# Patient Record
Sex: Male | Born: 1980 | Race: Black or African American | Hispanic: No | Marital: Married | Smoking: Never smoker
Health system: Southern US, Community
[De-identification: ages and names within clinical notes are randomized; demographics above are authoritative.]

## PROBLEM LIST (undated history)

## (undated) DIAGNOSIS — I10 Essential (primary) hypertension: Secondary | ICD-10-CM

---

## 2013-02-25 ENCOUNTER — Emergency Department (HOSPITAL_COMMUNITY)
Admission: EM | Admit: 2013-02-25 | Discharge: 2013-02-25 | Disposition: A | Discharge status: Home / Self Care | Payer: Medicaid Other | Attending: Emergency Medicine | Admitting: Emergency Medicine

## 2013-02-25 ENCOUNTER — Emergency Department (HOSPITAL_COMMUNITY): Payer: Medicaid Other

## 2013-02-25 ENCOUNTER — Encounter (HOSPITAL_COMMUNITY): Payer: Self-pay | Admitting: Emergency Medicine

## 2013-02-25 ENCOUNTER — Emergency Department (HOSPITAL_COMMUNITY)
Admission: EM | Admit: 2013-02-25 | Discharge: 2013-02-25 | Disposition: A | Discharge status: Nursing Home (Includes ICF) | Payer: Medicaid Other | Source: Home / Self Care | Attending: Emergency Medicine | Admitting: Emergency Medicine

## 2013-02-25 DIAGNOSIS — R109 Unspecified abdominal pain: Secondary | ICD-10-CM

## 2013-02-25 DIAGNOSIS — I10 Essential (primary) hypertension: Secondary | ICD-10-CM | POA: Insufficient documentation

## 2013-02-25 DIAGNOSIS — K2970 Gastritis, unspecified, without bleeding: Secondary | ICD-10-CM | POA: Insufficient documentation

## 2013-02-25 HISTORY — DX: Essential (primary) hypertension: I10

## 2013-02-25 LAB — URINALYSIS, ROUTINE W REFLEX MICROSCOPIC
Bilirubin Urine: NEGATIVE
Glucose, UA: NEGATIVE mg/dL
Ketones, ur: NEGATIVE mg/dL
Leukocytes, UA: NEGATIVE
Nitrite: NEGATIVE
Urobilinogen, UA: 0.2 mg/dL (ref 0.0–1.0)

## 2013-02-25 LAB — CBC WITH DIFFERENTIAL/PLATELET
Basophils Relative: 0 % (ref 0–1)
Eosinophils Absolute: 0 10*3/uL (ref 0.0–0.7)
HCT: 44.8 % (ref 39.0–52.0)
Hemoglobin: 15.8 g/dL (ref 13.0–17.0)
Lymphs Abs: 1.8 10*3/uL (ref 0.7–4.0)
MCH: 30.4 pg (ref 26.0–34.0)
MCHC: 35.3 g/dL (ref 30.0–36.0)
Monocytes Absolute: 0.2 10*3/uL (ref 0.1–1.0)
Monocytes Relative: 5 % (ref 3–12)
Neutrophils Relative %: 59 % (ref 43–77)
Platelets: 209 10*3/uL (ref 150–400)
RBC: 5.2 MIL/uL (ref 4.22–5.81)

## 2013-02-25 LAB — COMPREHENSIVE METABOLIC PANEL
AST: 36 U/L (ref 0–37)
Albumin: 4.4 g/dL (ref 3.5–5.2)
Alkaline Phosphatase: 91 U/L (ref 39–117)
BUN: 9 mg/dL (ref 6–23)
Chloride: 103 mEq/L (ref 96–112)
Creatinine, Ser: 0.94 mg/dL (ref 0.50–1.35)
GFR calc Af Amer: >90 mL/min (ref >90)
Glucose, Bld: 89 mg/dL (ref 70–99)
Potassium: 3.9 mEq/L (ref 3.5–5.1)
Total Bilirubin: 0.5 mg/dL (ref 0.3–1.2)
Total Protein: 7.9 g/dL (ref 6.0–8.3)

## 2013-02-25 LAB — LIPASE, BLOOD: Lipase: 25 U/L (ref 11–59)

## 2013-02-25 MED ORDER — PANTOPRAZOLE SODIUM 20 MG PO TBEC: 20.0000 mg | DELAYED_RELEASE_TABLET | Freq: Two times a day (BID) | ORAL | Status: AC

## 2013-02-25 MED ORDER — PANTOPRAZOLE SODIUM 40 MG IV SOLR
40.0000 mg | Freq: Once | INTRAVENOUS | Status: AC
  Administered 2013-02-25: 40 mg via INTRAVENOUS
  Filled 2013-02-25: qty 40

## 2013-02-25 NOTE — ED Notes (Signed)
Pt sent from [address] [facility] for eval of abd pain x 3 days that is worse after eating

## 2013-02-25 NOTE — ED Provider Notes (Signed)
CSN: [id number]     Arrival date & time 02/25/13  1127 History   First MD Initiated Contact with Patient 02/25/13 1144     Chief Complaint  Patient presents with  . Abdominal Pain   (Consider location/radiation/quality/duration/timing/severity/associated sxs/prior Treatment) HPI Comments: Patient is otherwise healthy 32 year old male who presents to the ED from [facility] for further evaluation of 3 day history of epigastric and RUQ abdominal pain.  He denies pain currently.  He states that this pain is associated with food intake and lying flat.  He reports daily alcohol use because of stressors in his life and a history of GERD.  He states nausea without vomiting, denies abdominal surgeries, fever, chills, cough, congestion, dysuria, hematuria, constipation or diarrhea.  Patient is a 32 y.o. male presenting with abdominal pain. The history is provided by the patient and the spouse. No language interpreter was used.  Abdominal Pain Pain location:  Epigastric and RUQ Pain quality: burning, cramping and squeezing   Pain radiates to:  RUQ Pain severity:  Moderate Onset quality:  Gradual Duration:  3 days Timing:  Intermittent Progression:  Worsening Chronicity:  New Context: alcohol use and eating   Context: not diet changes, not previous surgeries, not recent travel, not sick contacts, not suspicious food intake and not trauma   Relieved by:  Nothing Worsened by:  Nothing tried Ineffective treatments:  None tried Associated symptoms: nausea   Associated symptoms: no anorexia, no chest pain, no constipation, no cough, no diarrhea, no fever, no hematemesis, no hematuria, no shortness of breath and no vomiting     Past Medical History  Diagnosis Date  . Hypertension    History reviewed. No pertinent past surgical history. History reviewed. No pertinent family history. History  Substance Use Topics  . Smoking status: Never Smoker   . Smokeless tobacco: Not on file  . Alcohol Use: Yes     Review of Systems  Constitutional: Negative for fever.  Respiratory: Negative for cough and shortness of breath.   Cardiovascular: Negative for chest pain.  Gastrointestinal: Positive for nausea and abdominal pain. Negative for vomiting, diarrhea, constipation, anorexia and hematemesis.  Genitourinary: Negative for hematuria.  All other systems reviewed and are negative.    Allergies  Review of patient's allergies indicates no known allergies.  Home Medications  No current outpatient prescriptions on file. BP 127/92  Pulse 88  Temp(Src) 98.5 F (36.9 C) (Oral)  Resp 20  Ht 5\' 9"  (1.753 m)  Wt 187 lb 3.2 oz (84.913 kg)  BMI 27.63 kg/m2  SpO2 100% Physical Exam  Nursing note and vitals reviewed. Constitutional: He is oriented to person, place, and time. He appears well-developed and well-nourished. No distress.  HENT:  Head: Normocephalic and atraumatic.  Right Ear: External ear normal.  Left Ear: External ear normal.  Nose: Nose normal.  Mouth/Throat: Oropharynx is clear and moist. No oropharyngeal exudate.  Eyes: Conjunctivae are normal. Pupils are equal, round, and reactive to light. No scleral icterus.  Neck: Normal range of motion. Neck supple.  Cardiovascular: Normal rate, regular rhythm and normal heart sounds.  Exam reveals no gallop and no friction rub.   No murmur heard. Pulmonary/Chest: Effort normal and breath sounds normal. No respiratory distress. He has no wheezes. He has no rales. He exhibits no tenderness.  Abdominal: Soft. Bowel sounds are normal. He exhibits no distension. There is no tenderness. There is no rebound and no guarding.  No tenderness to palpation and no pain at this time.  Musculoskeletal: Normal range of motion. He exhibits no edema and no tenderness.  Lymphadenopathy:    He has no cervical adenopathy.  Neurological: He is alert and oriented to person, place, and time. He exhibits normal muscle tone. Coordination normal.  Skin: Skin is  warm and dry. No rash noted. No erythema. No pallor.  Psychiatric: He has a normal mood and affect. His behavior is normal. Judgment and thought content normal.    ED Course  Procedures (including critical care time) Labs Review Labs Reviewed  CBC WITH DIFFERENTIAL  COMPREHENSIVE METABOLIC PANEL  LIPASE, BLOOD  URINALYSIS, ROUTINE W REFLEX MICROSCOPIC   Imaging Review No results found.  EKG Interpretation   None      Results for orders placed during the hospital encounter of 02/25/13  CBC WITH DIFFERENTIAL      Result Value Range   WBC 5.1  4.0 - 10.5 K/uL   RBC 5.20  4.22 - 5.81 MIL/uL   Hemoglobin 15.8  13.0 - 17.0 g/dL   HCT 16.1  09.6 - 04.5 %   MCV 86.2  78.0 - 100.0 fL   MCH 30.4  26.0 - 34.0 pg   MCHC 35.3  30.0 - 36.0 g/dL   RDW 40.9  81.1 - 91.4 %   Platelets 209  150 - 400 K/uL   Neutrophils Relative % 59  43 - 77 %   Neutro Abs 3.0  1.7 - 7.7 K/uL   Lymphocytes Relative 35  12 - 46 %   Lymphs Abs 1.8  0.7 - 4.0 K/uL   Monocytes Relative 5  3 - 12 %   Monocytes Absolute 0.2  0.1 - 1.0 K/uL   Eosinophils Relative 1  0 - 5 %   Eosinophils Absolute 0.0  0.0 - 0.7 K/uL   Basophils Relative 0  0 - 1 %   Basophils Absolute 0.0  0.0 - 0.1 K/uL  COMPREHENSIVE METABOLIC PANEL      Result Value Range   Sodium 141  135 - 145 mEq/L   Potassium 3.9  3.5 - 5.1 mEq/L   Chloride 103  96 - 112 mEq/L   CO2 30  19 - 32 mEq/L   Glucose, Bld 89  70 - 99 mg/dL   BUN 9  6 - 23 mg/dL   Creatinine, Ser 7.82  0.50 - 1.35 mg/dL   Calcium 9.3  8.4 - 95.6 mg/dL   Total Protein 7.9  6.0 - 8.3 g/dL   Albumin 4.4  3.5 - 5.2 g/dL   AST 36  0 - 37 U/L   ALT 128 (*) 0 - 53 U/L   Alkaline Phosphatase 91  39 - 117 U/L   Total Bilirubin 0.5  0.3 - 1.2 mg/dL   GFR calc non Af Amer >90  >90 mL/min   GFR calc Af Amer >90  >90 mL/min  LIPASE, BLOOD      Result Value Range   Lipase 25  11 - 59 U/L  URINALYSIS, ROUTINE W REFLEX MICROSCOPIC      Result Value Range   Color, Urine YELLOW   YELLOW   APPearance CLEAR  CLEAR   Specific Gravity, Urine 1.020  1.005 - 1.030   pH 6.5  5.0 - 8.0   Glucose, UA NEGATIVE  NEGATIVE mg/dL   Hgb urine dipstick NEGATIVE  NEGATIVE   Bilirubin Urine NEGATIVE  NEGATIVE   Ketones, ur NEGATIVE  NEGATIVE mg/dL   Protein, ur NEGATIVE  NEGATIVE mg/dL   Urobilinogen,  UA 0.2  0.0 - 1.0 mg/dL   Nitrite NEGATIVE  NEGATIVE   Leukocytes, UA NEGATIVE  NEGATIVE   US Abdomen Complete  02/25/2013   CLINICAL DATA:  Abdominal pain  EXAM: ULTRASOUND ABDOMEN COMPLETE  COMPARISON:  None.  FINDINGS: Gallbladder  No gallstones, gallbladder wall thickening, or pericholecystic fluid. Negative sonographic Murphy's sign.  Common bile duct  Diameter: 5 mm.  Liver  No focal lesion identified. Within normal limits in parenchymal echogenicity.  IVC  No abnormality visualized.  Pancreas  Poorly visualized due to overlying bowel gas.  Spleen  Measures 6.6 cm.  Right Kidney  Length: 11.3 cm.  No mass or hydronephrosis.  Left Kidney  Length: 12.4 cm.  9 x 8 x 10 mm upper pole cyst.  No hydronephrosis.  Abdominal aorta  No aneurysm visualized.  IMPRESSION: 10 mm left upper pole renal cyst.  Otherwise negative abdominal ultrasound.   Electronically Signed   By: Charline Bills M.D.   On: 02/25/2013 14:12      MDM  Alcoholic gastritis   Patient here from [facility] for evaluation of epigastric abdominal pain, no evidence of gallbladder disease, likely this is more alcoholic gastritis.  Continued with no pain here.  Patient given protonix here - will prescribe the same.   Izola Price Marisue Humble, PA-C 02/25/13 1445

## 2013-02-25 NOTE — ED Notes (Signed)
Pt c/o abd pain onset 3 days Sxs also include: n/v Denies: f/d, cold sxs Reports he just got over the cold last week Alert w/no signs of acute distress.

## 2013-02-25 NOTE — ED Provider Notes (Signed)
CSN: [id number]     Arrival date & time 02/25/13  4098 History   First MD Initiated Contact with Patient 02/25/13 1031     Chief Complaint  Patient presents with  . Abdominal Pain   (Consider location/radiation/quality/duration/timing/severity/associated sxs/prior Treatment) HPI Comments: 32 year old male presents complaining of abdominal pain. This pain began approximately 10 days ago and was very mild at that time. Since that time, the pain has grown increasingly more severe and has been constant for the last 2 days. The pain is exacerbated by eating  anything and is somewhat relieved by not eating. the pain radiates to his back and sometimes up to his right shoulder. he was vomiting and last vomited on Sunday, 3 days ago. He has not had any diarrhea. He has a strong family history of gallbladder disease. He has been drinking a large amount of alcohol for the past month every night. He denies fever, chills, hematuria, dysuria, flank pain. He has taken Phenergan for the nausea which did help.   Patient is a 32 y.o. male presenting with abdominal pain.  Abdominal Pain Associated symptoms include abdominal pain. Pertinent negatives include no chest pain and no shortness of breath.    History reviewed. No pertinent past medical history. History reviewed. No pertinent past surgical history. No family history on file. History  Substance Use Topics  . Smoking status: Never Smoker   . Smokeless tobacco: Not on file  . Alcohol Use: Yes    Review of Systems  Constitutional: Negative for fever, chills and fatigue.  HENT: Negative for sore throat.   Eyes: Negative for visual disturbance.  Respiratory: Negative for cough and shortness of breath.   Cardiovascular: Negative for chest pain, palpitations and leg swelling.  Gastrointestinal: Positive for nausea, vomiting and abdominal pain. Negative for diarrhea and constipation.  Genitourinary: Negative for dysuria, urgency, frequency and  hematuria.  Musculoskeletal: Negative for arthralgias, myalgias, neck pain and neck stiffness.  Skin: Negative for rash.  Neurological: Negative for dizziness, weakness and light-headedness.    Allergies  Review of patient's allergies indicates no known allergies.  Home Medications  No current outpatient prescriptions on file. BP 140/89  Pulse 83  Temp(Src) 98.6 F (37 C) (Oral)  Resp 18  SpO2 100% Physical Exam  Nursing note and vitals reviewed. Constitutional: He is oriented to person, place, and time. He appears well-developed and well-nourished. No distress.  HENT:  Head: Normocephalic.  Pulmonary/Chest: Effort normal. No respiratory distress.  Abdominal: Soft. There is no hepatosplenomegaly (difficult to assess due to musculature). There is tenderness in the right lower quadrant and epigastric area. There is no rigidity, no guarding, no CVA tenderness and negative Murphy's sign.  Neurological: He is alert and oriented to person, place, and time. Coordination normal.  Skin: Skin is warm and dry. No rash noted. He is not diaphoretic.  Psychiatric: He has a normal mood and affect. Judgment normal.    ED Course  Procedures (including critical care time) Labs Review Labs Reviewed - No data to display Imaging Review No results found.    MDM   1. Abdominal pain    He provided history of this patient's pain is consistent with biliary disease. The differential for this includes cholelithiasis/cholecystitis, pancreatitis, ulcer. He has been consuming an excessive amount of alcohol recently which may predispose to pancreatitis or cholelithiasis. He has a strong family history of gallbladder disease. His exam is relatively unremarkable but he does have abdominal tenderness in the epigastrium that radiates to the back.  I believe this patient would benefit from a CT scan of the abdomen as part of his evaluation. He is being transferred to the emergency department for further  evaluation.     Graylon Good, PA-C 02/25/13 1102

## 2013-02-25 NOTE — ED Provider Notes (Signed)
Medical screening examination/treatment/procedure(s) were performed by non-physician practitioner and as supervising physician I was immediately available for consultation/collaboration.  EKG Interpretation   None         Lyanne Co, MD 02/25/13 [phone]

## 2013-02-25 NOTE — ED Notes (Addendum)
Scarlette Calico PA at bedside. Pt aware of need for urine specimen.

## 2013-02-25 NOTE — ED Provider Notes (Signed)
Medical screening examination/treatment/procedure(s) were performed by non-physician practitioner and as supervising physician I was immediately available for consultation/collaboration.  Leslee Home, M.D.  Reuben Likes, MD 02/25/13 [phone]

## 2013-05-09 ENCOUNTER — Encounter (HOSPITAL_COMMUNITY): Payer: Self-pay | Admitting: Emergency Medicine

## 2013-05-09 ENCOUNTER — Emergency Department (INDEPENDENT_AMBULATORY_CARE_PROVIDER_SITE_OTHER)
Admission: EM | Admit: 2013-05-09 | Discharge: 2013-05-09 | Disposition: A | Discharge status: Home / Self Care | Payer: Worker's Compensation | Source: Home / Self Care | Attending: Family Medicine | Admitting: Family Medicine

## 2013-05-09 DIAGNOSIS — Y990 Civilian activity done for income or pay: Secondary | ICD-10-CM

## 2013-05-09 DIAGNOSIS — S060X9A Concussion with loss of consciousness of unspecified duration, initial encounter: Secondary | ICD-10-CM | POA: Diagnosis not present

## 2013-05-09 DIAGNOSIS — S060XAA Concussion with loss of consciousness status unknown, initial encounter: Secondary | ICD-10-CM | POA: Diagnosis not present

## 2013-05-09 DIAGNOSIS — T1490XA Injury, unspecified, initial encounter: Secondary | ICD-10-CM | POA: Diagnosis not present

## 2013-05-09 MED ORDER — DICLOFENAC SODIUM 75 MG PO TBEC: 75.0000 mg | DELAYED_RELEASE_TABLET | Freq: Two times a day (BID) | ORAL | Status: AC | PRN

## 2013-05-09 NOTE — ED Notes (Signed)
Was called to registration because patient was nauseated.  Patient reports hitting his head today on a door facing, slight redness to left eyebrow, some swelling.  Patient denies loc, remembers everything.  Patient has a headache, and has nausea that is increasing.  Informed placement in room would not take much longer and let staff know if anything changes

## 2013-05-09 NOTE — Discharge Instructions (Signed)
Thank you for coming in today. Used diclofenac twice daily as needed for pain. Take it easy and avoid eye strain. Return to work when not feeling foggy or fuzzy at rest. Followup with me next Sunday or Dr. Katrinka Blazing this upcoming week as needed.  Concussion, Adult A concussion, or closed-head injury, is a brain injury caused by a direct blow to the head or by a quick and sudden movement (jolt) of the head or neck. Concussions are usually not life-threatening. Even so, the effects of a concussion can be serious. If you have had a concussion before, you are more likely to experience concussion-like symptoms after a direct blow to the head.  CAUSES   Direct blow to the head, such as from running into another player during a soccer game, being hit in a fight, or hitting your head on a hard surface.  A jolt of the head or neck that causes the brain to move back and forth inside the skull, such as in a car crash. SIGNS AND SYMPTOMS  The signs of a concussion can be hard to notice. Early on, they may be missed by you, family members, and health care providers. You may look fine but act or feel differently. Symptoms are usually temporary, but they may last for days, weeks, or even longer. Some symptoms may appear right away while others may not show up for hours or days. Every head injury is different. Symptoms include:   Mild to moderate headaches that will not go away.  A feeling of pressure inside your head.  Having more trouble than usual:   Learning or remembering things you have heard.  Answering questions.  Paying attention or concentrating.   Organizing daily tasks.   Making decisions and solving problems.   Slowness in thinking, acting or reacting, speaking, or reading.   Getting lost or being easily confused.   Feeling tired all the time or lacking energy (fatigued).   Feeling drowsy.   Sleep disturbances.   Sleeping more than usual.   Sleeping less than usual.    Trouble falling asleep.   Trouble sleeping (insomnia).   Loss of balance or feeling lightheaded or dizzy.   Nausea or vomiting.   Numbness or tingling.   Increased sensitivity to:   Sounds.   Lights.   Distractions.   Vision problems or eyes that tire easily.   Diminished sense of taste or smell.   Ringing in the ears.   Mood changes such as feeling sad or anxious.   Becoming easily irritated or angry for little or no reason.   Lack of motivation.  Seeing or hearing things other people do not see or hear (hallucinations). DIAGNOSIS  Your health care provider can usually diagnose a concussion based on a description of your injury and symptoms. He or she will ask whether you passed out (lost consciousness) and whether you are having trouble remembering events that happened right before and during your injury.  Your evaluation might include:   A brain scan to look for signs of injury to the brain. Even if the test shows no injury, you may still have a concussion.   Blood tests to be sure other problems are not present. TREATMENT   Concussions are usually treated in an emergency department, in urgent care, or at a clinic. You may need to stay in the hospital overnight for further treatment.   Tell your health care provider if you are taking any medicines, including prescription medicines, over-the-counter medicines, and  natural remedies. Some medicines, such as blood thinners (anticoagulants) and aspirin, may increase the chance of complications. Also tell your health care provider whether you have had alcohol or are taking illegal drugs. This information may affect treatment.  Your health care provider will send you home with important instructions to follow.  How fast you will recover from a concussion depends on many factors. These factors include how severe your concussion is, what part of your brain was injured, your age, and how healthy you were  before the concussion.  Most people with mild injuries recover fully. Recovery can take time. In general, recovery is slower in older persons. Also, persons who have had a concussion in the past or have other medical problems may find that it takes longer to recover from their current injury. HOME CARE INSTRUCTIONS  General Instructions  Carefully follow the directions your health care provider gave you.  Only take over-the-counter or prescription medicines for pain, discomfort, or fever as directed by your health care provider.  Take only those medicines that your health care provider has approved.  Do not drink alcohol until your health care provider says you are well enough to do so. Alcohol and certain other drugs may slow your recovery and can put you at risk of further injury.  If it is harder than usual to remember things, write them down.  If you are easily distracted, try to do one thing at a time. For example, do not try to watch TV while fixing dinner.  Talk with family members or close friends when making important decisions.  Keep all follow-up appointments. Repeated evaluation of your symptoms is recommended for your recovery.  Watch your symptoms and tell others to do the same. Complications sometimes occur after a concussion. Older adults with a brain injury may have a higher risk of serious complications such as of a blood clot on the brain.  Tell your teachers, school nurse, school counselor, coach, athletic trainer, or work Production designer, theatre/television/film about your injury, symptoms, and restrictions. Tell them about what you can or cannot do. They should watch for:   Increased problems with attention or concentration.   Increased difficulty remembering or learning new information.   Increased time needed to complete tasks or assignments.   Increased irritability or decreased ability to cope with stress.   Increased symptoms.   Rest. Rest helps the brain to heal. Make sure  you:  Get plenty of sleep at night. Avoid staying up late at night.  Keep the same bedtime hours on weekends and weekdays.  Rest during the day. Take daytime naps or rest breaks when you feel tired.  Limit activities that require a lot of thought or concentration. These includes   Doing homework or job-related work.   Watching TV.   Working on the computer.  Avoid any situation where there is potential for another head injury (football, hockey, soccer, basketball, martial arts, downhill snow sports and horseback riding). Your condition will get worse every time you experience a concussion. You should avoid these activities until you are evaluated by the appropriate follow-up caregivers. Returning To Your Regular Activities You will need to return to your normal activities slowly, not all at once. You must give your body and brain enough time for recovery.  Do not return to sports or other athletic activities until your health care provider tells you it is safe to do so.  Ask your health care provider when you can drive, ride a bicycle, or operate  heavy machinery. Your ability to react may be slower after a brain injury. Never do these activities if you are dizzy.  Ask your health care provider about when you can return to work or school. Preventing Another Concussion It is very important to avoid another brain injury, especially before you have recovered. In rare cases, another injury can lead to permanent brain damage, brain swelling, or death. The risk of this is greatest during the first 7 10 days after a head injury. Avoid injuries by:   Wearing a seat belt when riding in a car.   Drinking alcohol only in moderation.   Wearing a helmet when biking, skiing, skateboarding, skating, or doing similar activities.  Avoiding activities that could lead to a second concussion, such as contact or recreational sports, until your health care provider says it is OK.  Taking safety  measures in your home.   Remove clutter and tripping hazards from floors and stairways.   Use grab bars in bathrooms and handrails by stairs.   Place non-slip mats on floors and in bathtubs.   Improve lighting in dim areas. SEEK MEDICAL CARE IF:   You have increased problems paying attention or concentrating.   You have increased difficulty remembering or learning new information.   You need more time to complete tasks or assignments than before.   You have increased irritability or decreased ability to cope with stress.  You have more symptoms than before. Seek medical care if you have any of the following symptoms for more than 2 weeks after your injury:   Lasting (chronic) headaches.   Dizziness or balance problems.   Nausea.  Vision problems.   Increased sensitivity to noise or light.   Depression or mood swings.   Anxiety or irritability.   Memory problems.   Difficulty concentrating or paying attention.   Sleep problems.   Feeling tired all the time. SEEK IMMEDIATE MEDICAL CARE IF:   You have severe or worsening headaches. These may be a sign of a blood clot in the brain.  You have weakness (even if only in one hand, leg, or part of the face).  You have numbness.  You have decreased coordination.   You vomit repeatedly.  You have increased sleepiness.  One pupil is larger than the other.   You have convulsions.   You have slurred speech.   You have increased confusion. This may be a sign of a blood clot in the brain.  You have increased restlessness, agitation, or irritability.   You are unable to recognize people or places.   You have neck pain.   It is difficult to wake you up.   You have unusual behavior changes.   You lose consciousness. MAKE SURE YOU:   Understand these instructions.  Will watch your condition.  Will get help right away if you are not doing well or get worse. Document Released:  06/09/2003 Document Revised: 11/19/2012 Document Reviewed: 10/09/2012 [facility] Patient Information 2014 [place], [place].

## 2013-05-09 NOTE — ED Provider Notes (Signed)
Alex Hays is a 33 y.o. male who presents to Urgent Care today for head injury. Patient hit his left forehead/eyebrow on the door frame at work today at about 2:15. He denies any loss of consciousness. He notes feeling foggy and fuzzy with a mild headache and mild transient nausea. He notes photophobia as well. He has had a concussion in the past and says his symptoms are consistent with prior concussion. He denies any weakness or numbness loss of function or difficulty swallowing or slurred speech. His wife says that he is acting normally.   Past Medical History  Diagnosis Date  . Hypertension    History  Substance Use Topics  . Smoking status: Never Smoker   . Smokeless tobacco: Not on file  . Alcohol Use: Yes   ROS as above Medications: No current facility-administered medications for this encounter.   Current Outpatient Prescriptions  Medication Sig Dispense Refill  . pantoprazole (PROTONIX) 20 MG tablet Take 1 tablet (20 mg total) by mouth 2 (two) times daily.  60 tablet  2  . diclofenac (VOLTAREN) 75 MG EC tablet Take 1 tablet (75 mg total) by mouth 2 (two) times daily as needed.  60 tablet  0  . [DISCONTINUED] promethazine (PHENERGAN) 25 MG tablet Take 25 mg by mouth every 6 (six) hours as needed for nausea or vomiting.        Exam:  BP 141/89  Pulse 73  Temp(Src) 98.9 F (37.2 C) (Oral)  Resp 16  SpO2 100% Gen: Well NAD HEENT: EOMI,  MMM, left eyebrow contusion. No crepitations palpated. PERRLA Lungs: Normal work of breathing. CTABL Heart: RRR no MRG Abd: NABS, Soft. NT, ND Exts: Brisk capillary refill, warm and well perfused.  Neuro: Alert and oriented x3 cranial nerves II through XII are intact normal balance coordination sensation and gait. Finger-nose-finger is intact.  Months of the year backwards is intact. 3 item recall is intact initially but 2/3 delayed.   Assessment and Plan: 33 y.o. male with concussion. Plan for treatment with diclofenac for  headache. Advise cognitive rest. Work note provided. Followup with sports medicine or myself in one week if not improving  Discussed warning signs or symptoms. Please see discharge instructions. Patient expresses understanding.    Rodolph BongEvan S Dolton Shaker, MD 05/09/13 [phone]

## 2013-05-09 NOTE — ED Notes (Signed)
Pt reports head inj today around 1415 while at work Ran into a wooden door and hit the left side head/eyebrow Denies LOC; pain is 8/10 Alert and talking in complete sentences w/no signs of acute distress.

## 2014-06-15 IMAGING — US US ABDOMEN COMPLETE
1 series · 14 of 25 positions shown · non-contrast
Comparison: None.

CLINICAL DATA: Abdominal pain

EXAM:
ULTRASOUND ABDOMEN COMPLETE

[Series 1: us abdomen complete · 0.24mm/px · 14 of 70 slices shown]
[im 1/70]
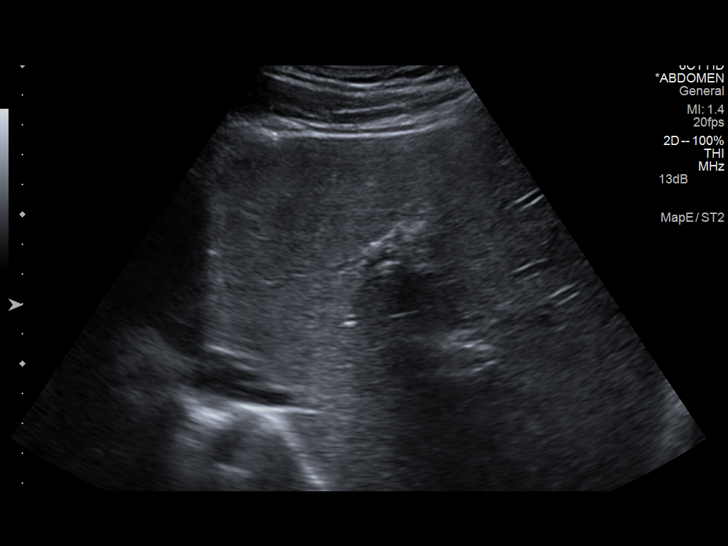
[im 6/70]
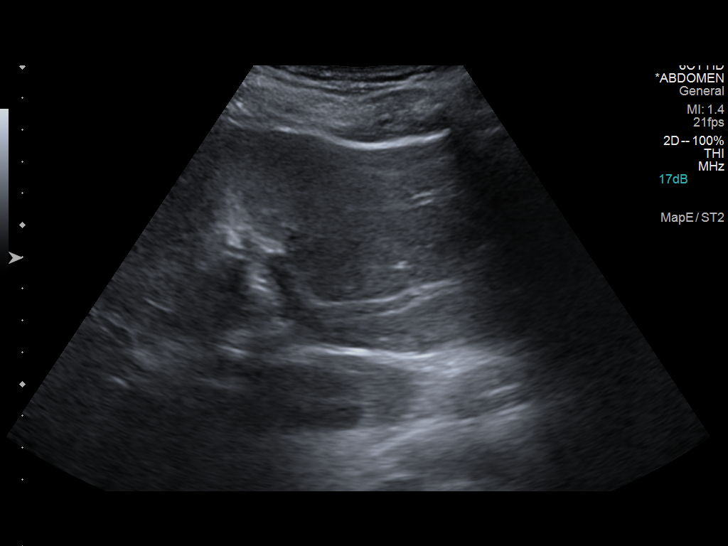
[im 12/70]
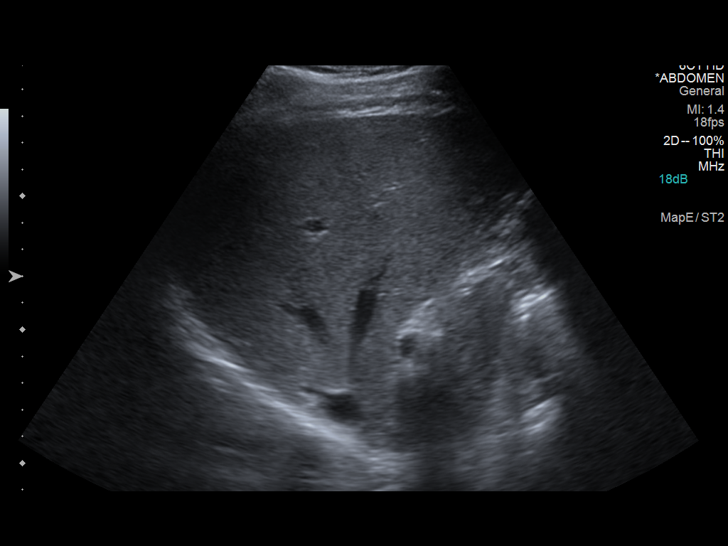
[im 18/70]
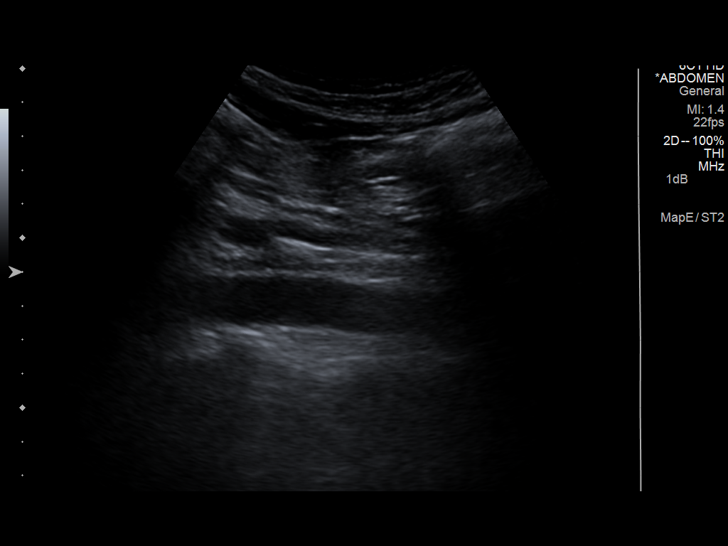
[im 24/70]
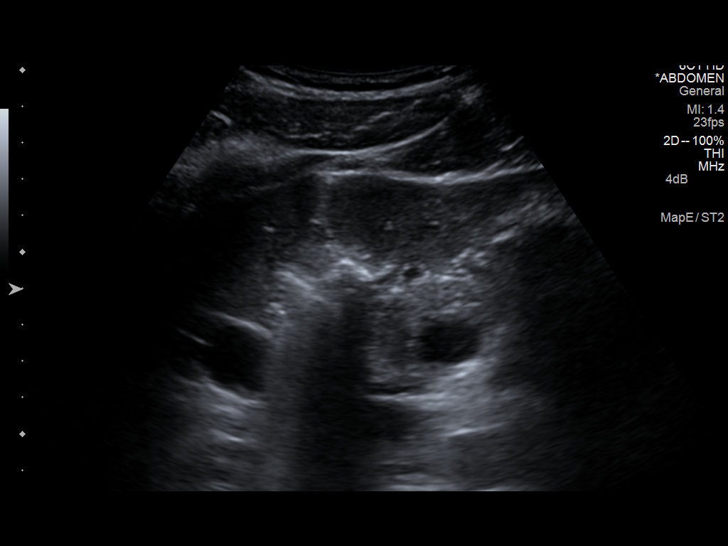
[im 26/70]
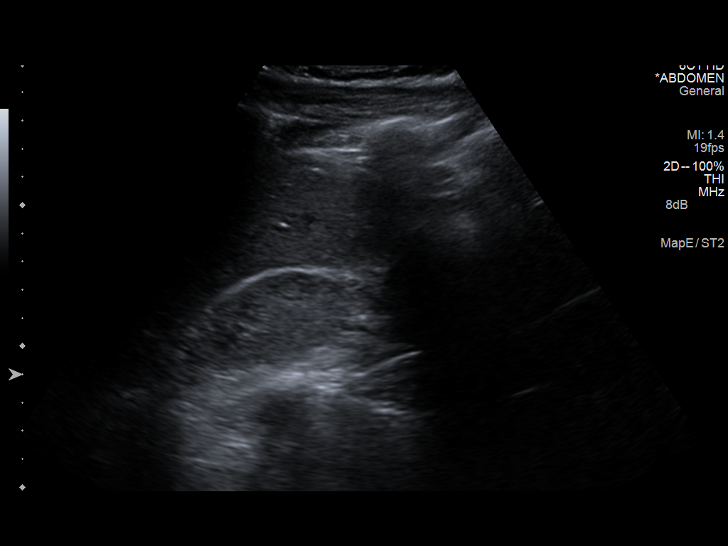
[im 32/70]
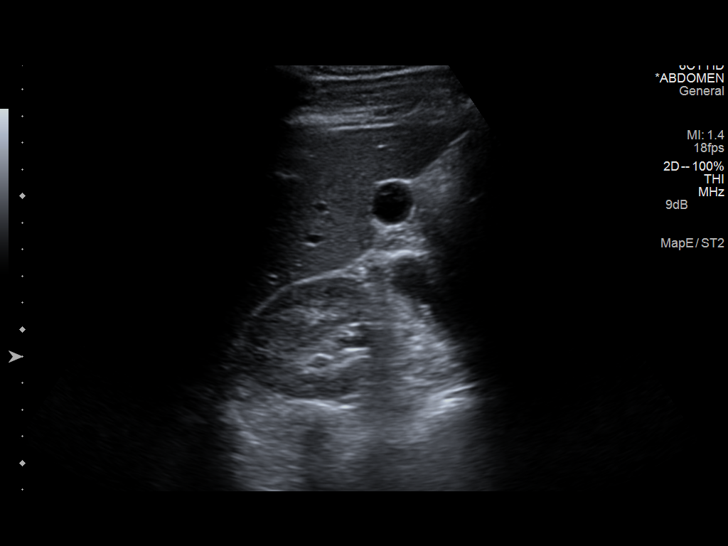
[im 38/70]
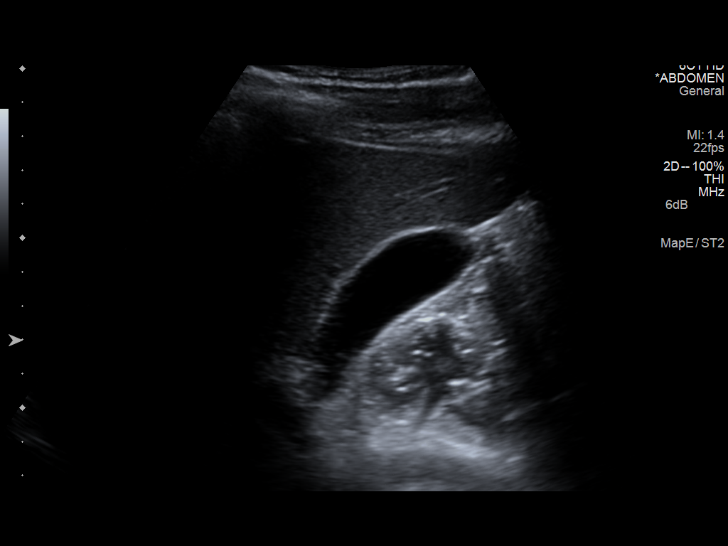
[im 44/70]
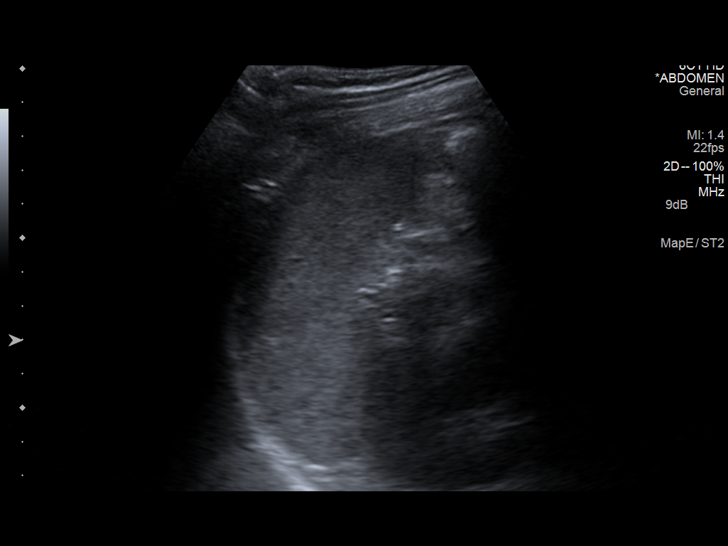
[im 47/70]
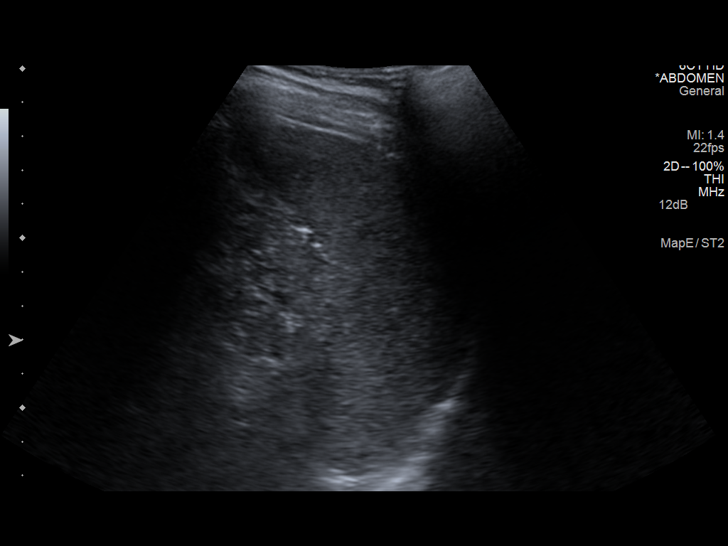
[im 52/70]
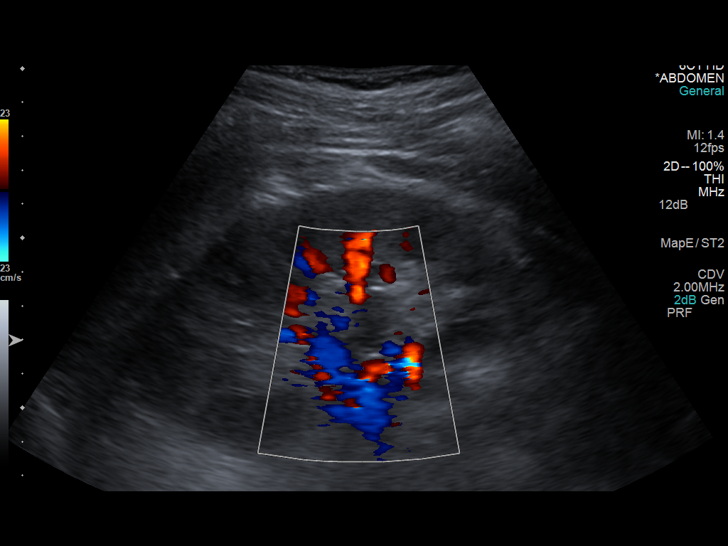
[im 58/70]
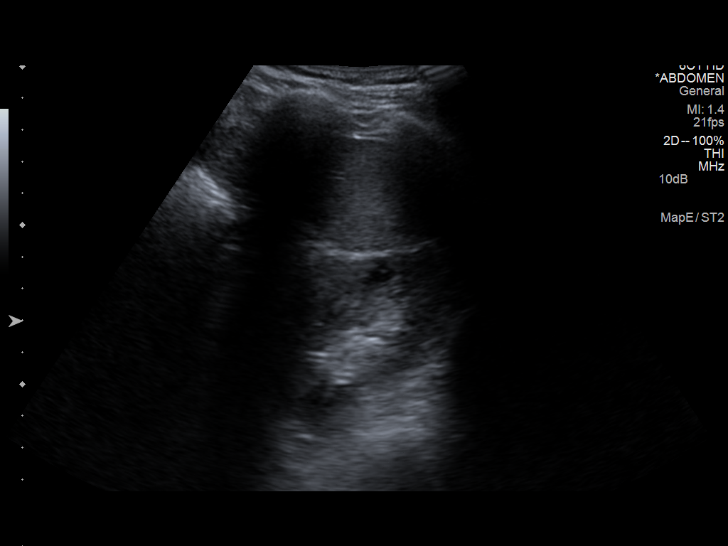
[im 64/70]
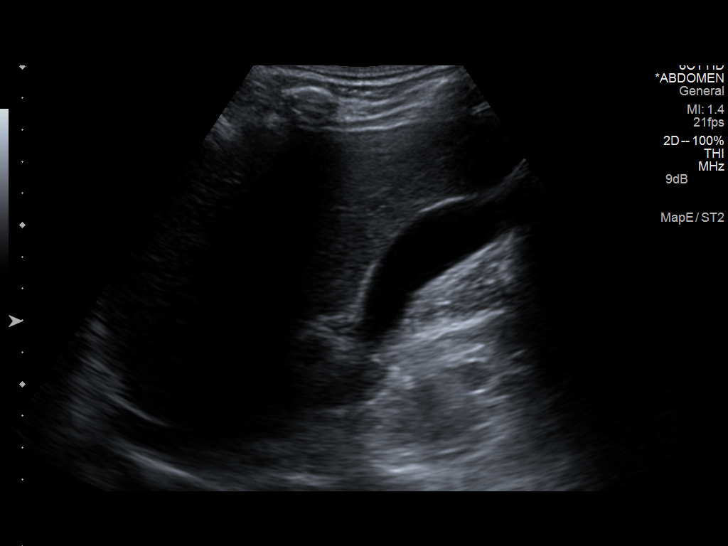
[im 70/70]
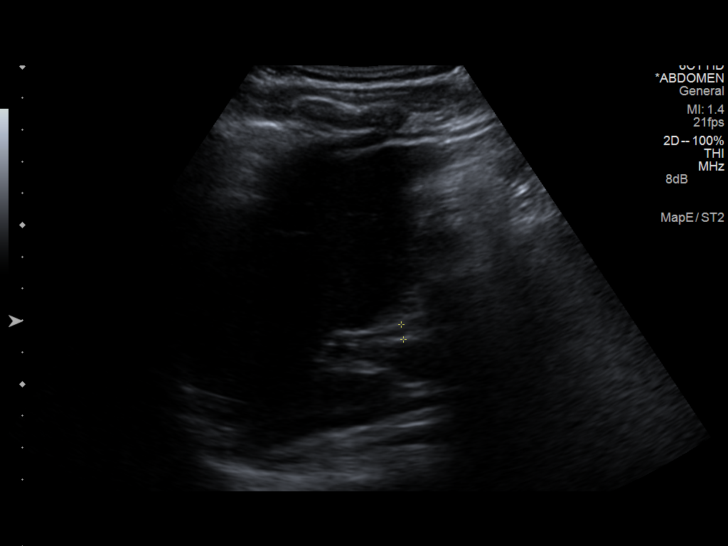

[14 of 25 positions shown; findings below may reference images not displayed]

FINDINGS: Gallbladder

No gallstones, gallbladder wall thickening, or pericholecystic
fluid. Negative sonographic Murphy's sign.

Common bile duct

Diameter: 5 mm.

Liver

No focal lesion identified. Within normal limits in parenchymal
echogenicity.

IVC

No abnormality visualized.

Pancreas

Poorly visualized due to overlying bowel gas.

Spleen

Measures 6.6 cm.

Right Kidney

Length: 11.3 cm.  No mass or hydronephrosis.

Left Kidney

Length: 12.4 cm.  9 x 8 x 10 mm upper pole cyst.  No hydronephrosis.

Abdominal aorta

No aneurysm visualized.
IMPRESSION: 10 mm left upper pole renal cyst.

Otherwise negative abdominal ultrasound.
